# Patient Record
Sex: Female | Born: 1960 | Race: White | Hispanic: No | Marital: Married
Health system: Southern US, Community
[De-identification: ages and names within clinical notes are randomized; demographics above are authoritative.]

---

## 2013-11-27 ENCOUNTER — Other Ambulatory Visit: Payer: Self-pay | Admitting: Otolaryngology

## 2013-11-27 DIAGNOSIS — H7010 Chronic mastoiditis, unspecified ear: Secondary | ICD-10-CM

## 2013-12-04 ENCOUNTER — Ambulatory Visit
Admission: RE | Admit: 2013-12-04 | Discharge: 2013-12-04 | Disposition: A | Discharge status: Home / Self Care | Payer: BC Managed Care – PPO | Source: Ambulatory Visit | Attending: Otolaryngology | Admitting: Otolaryngology

## 2013-12-04 DIAGNOSIS — H7010 Chronic mastoiditis, unspecified ear: Secondary | ICD-10-CM

## 2013-12-04 MED ORDER — IOHEXOL 300 MG/ML  SOLN
75.0000 mL | Freq: Once | INTRAMUSCULAR | Status: AC | PRN
  Administered 2013-12-04: 75 mL via INTRAVENOUS

## 2013-12-31 ENCOUNTER — Other Ambulatory Visit: Payer: Self-pay | Admitting: Otolaryngology

## 2015-09-03 IMAGING — CT CT TEMPORAL BONES W/ CM
4 of 6 series · 17 of 30 positions shown, 19 images · IV contrast (75CC OMNI 300)
Comparison: None.

CLINICAL DATA: Chronic mastoiditis.

EXAM:
CT TEMPORAL BONES WITH CONTRAST
TECHNIQUE: Axial and coronal plane CT imaging of the petrous temporal bones was
performed with thin-collimation image reconstruction after
intravenous contrast administration. Multiplanar CT image
reconstructions were also generated.
CONTRAST:  75mL OMNIPAQUE IOHEXOL 300 MG/ML  SOLN

[Series 3: ax mag right · axial · 0.20mm/px · z∈[-45,+3]mm · 5 of 233 slices shown, 7 images]
[im 39/233  brain]
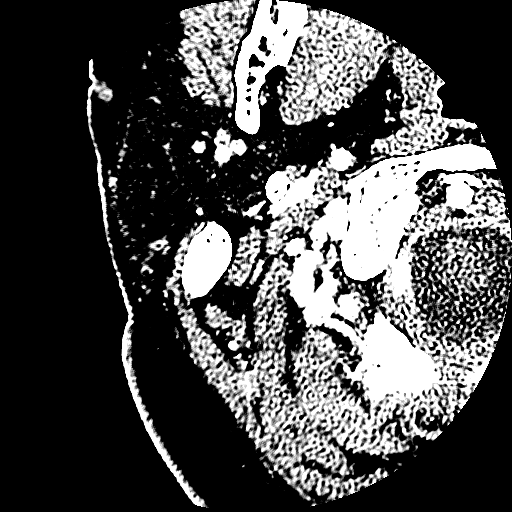
[im 39/233  bone]
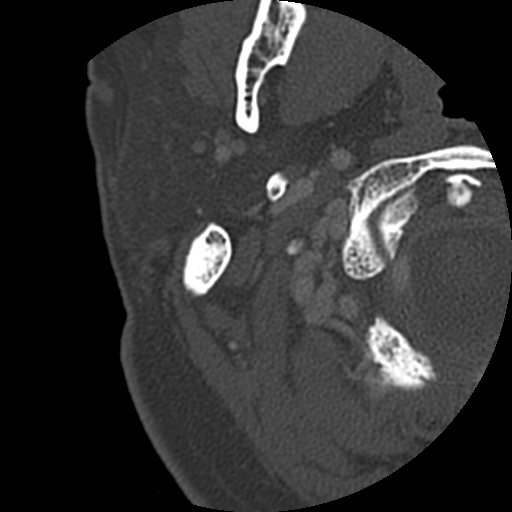
[im 78/233  bone]
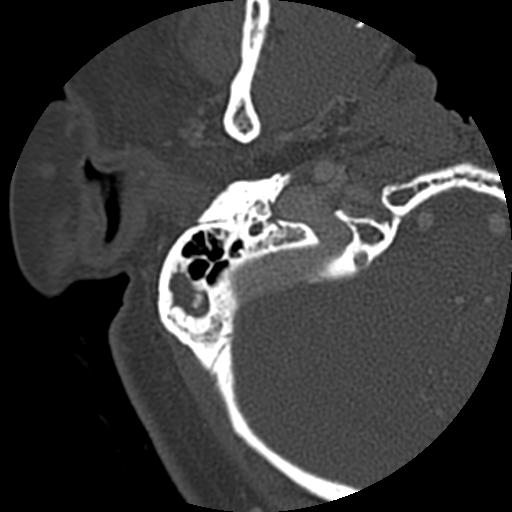
[im 117/233  bone]
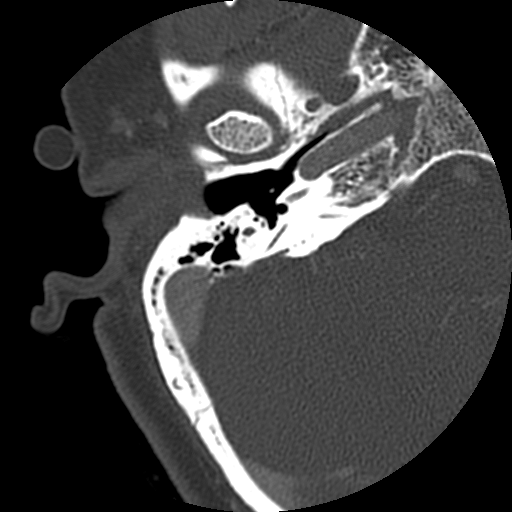
[im 155/233  bone]
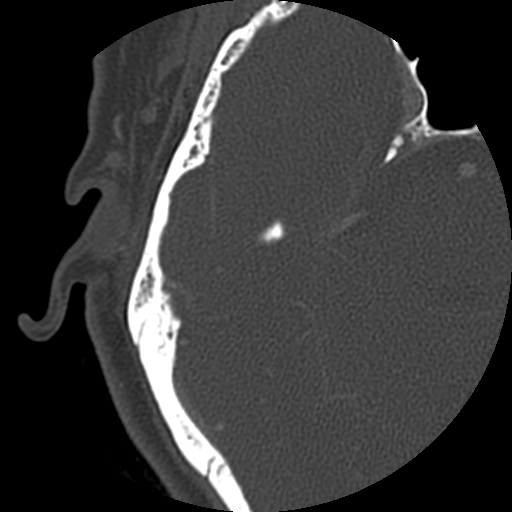
[im 194/233  brain]
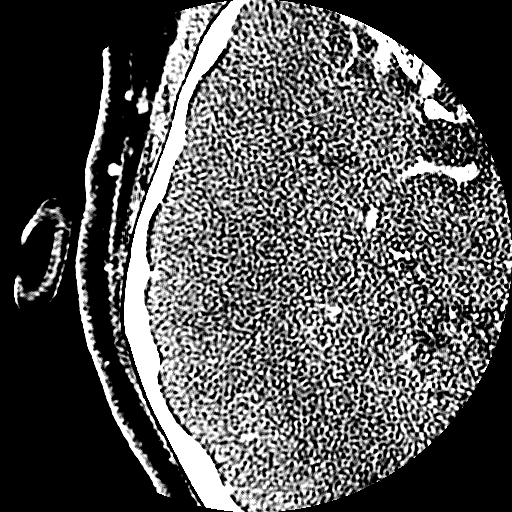
[im 194/233  bone]
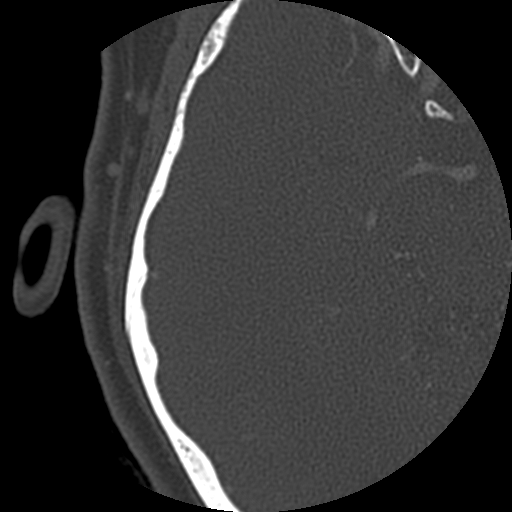

[Series 4: ax mag left · axial · 0.20mm/px · z∈[-42,+1]mm · 4 of 233 slices shown]
[im 47/233  bone]
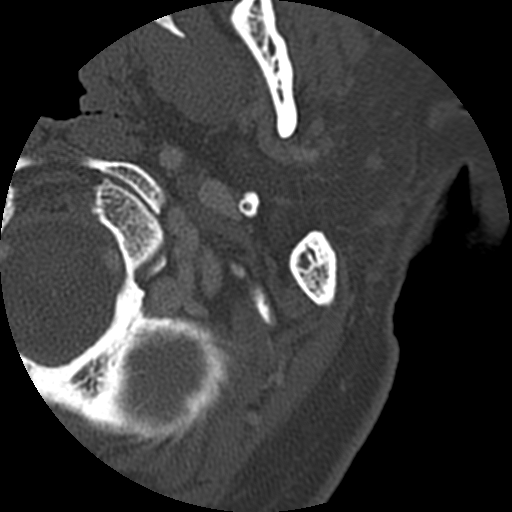
[im 93/233  bone]
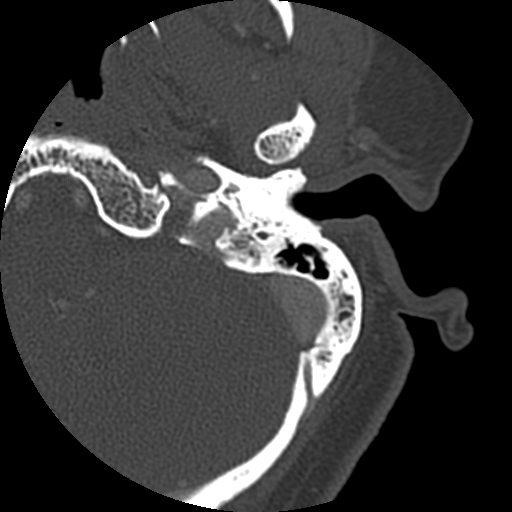
[im 140/233  bone]
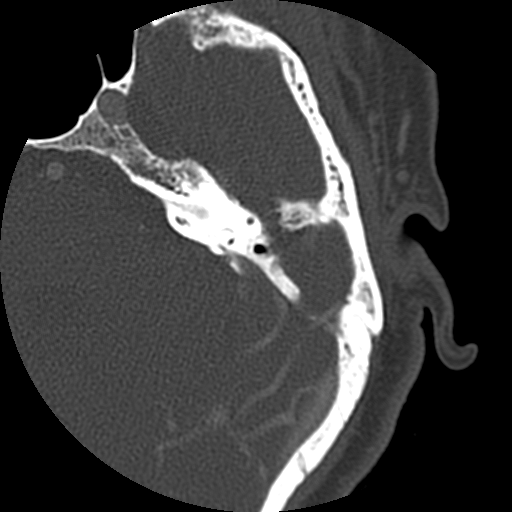
[im 186/233  bone]
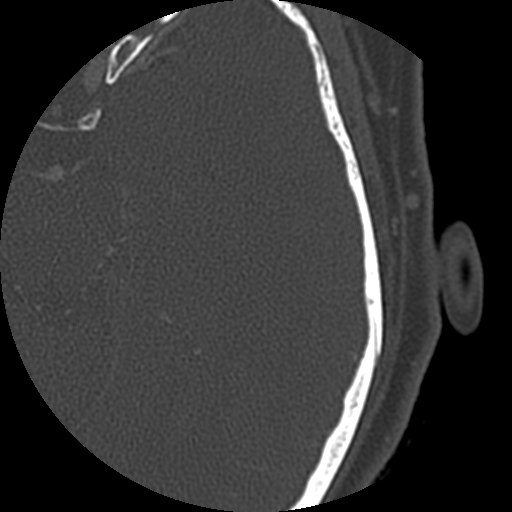

[Series 300: cor right mag · coronal · 0.20mm/px · 3 of 230 slices shown]
[im 46/230  bone]
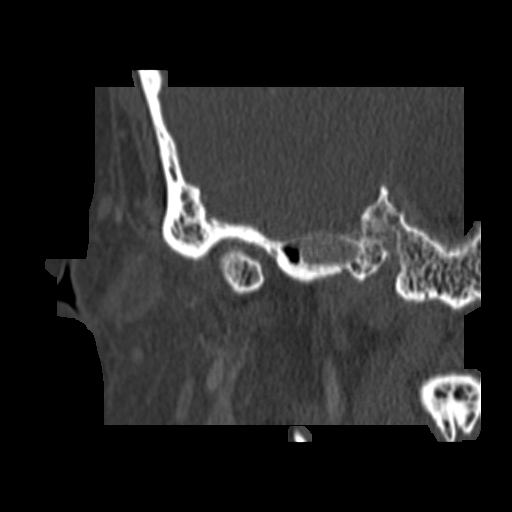
[im 92/230  bone]
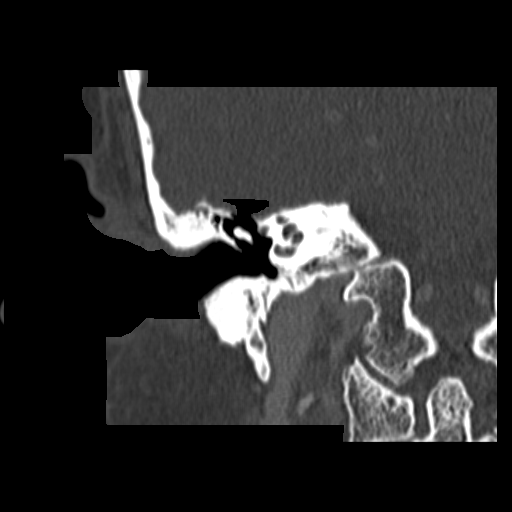
[im 138/230  bone]
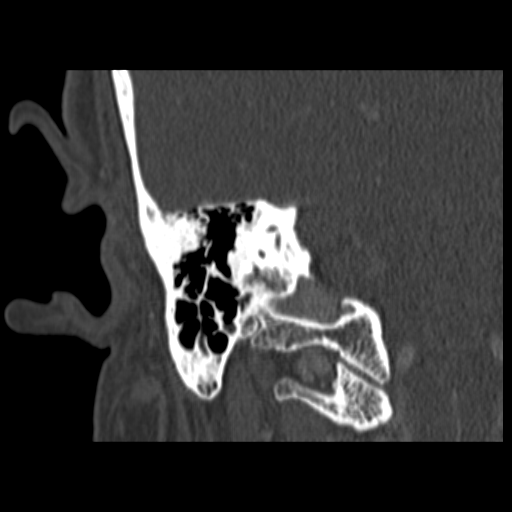

[Series 400: cor left mag · coronal · 0.20mm/px · 5 of 240 slices shown]
[im 40/240  bone]
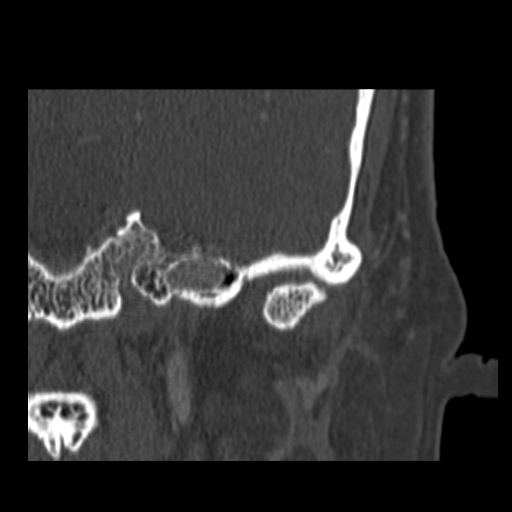
[im 80/240  bone]
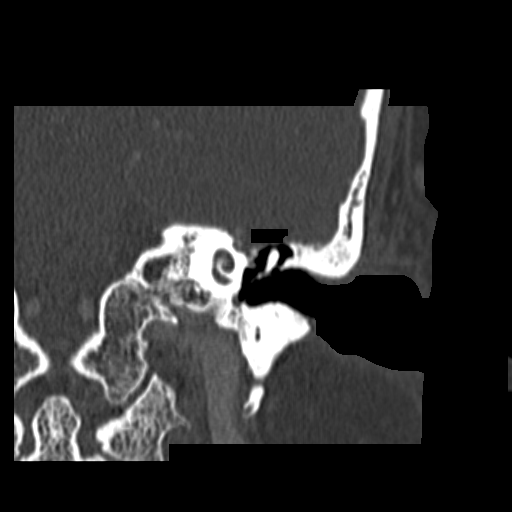
[im 120/240  bone]
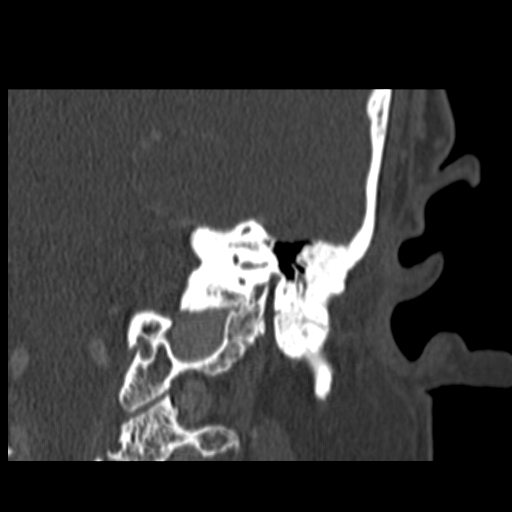
[im 160/240  bone]
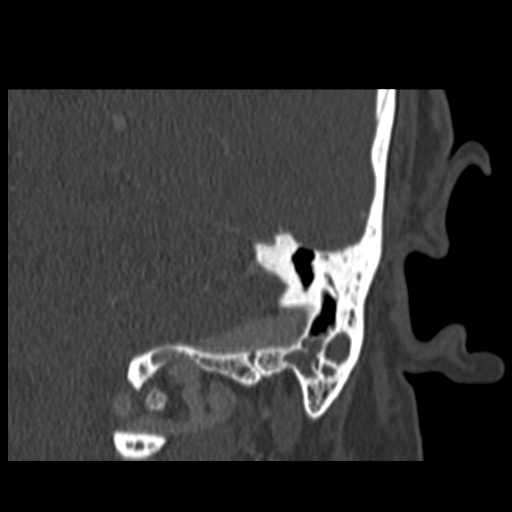
[im 200/240  bone]
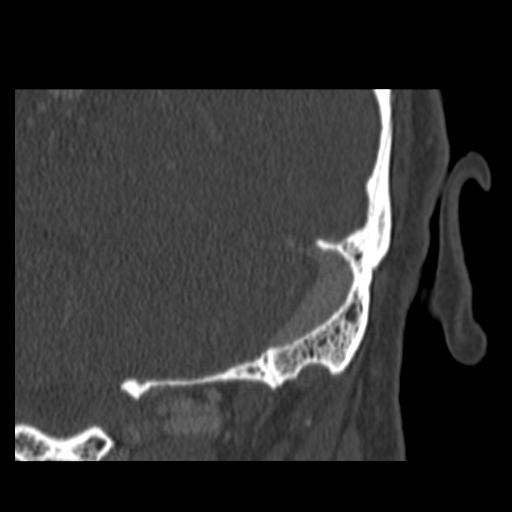

[17 of 30 positions shown; findings below may reference images not displayed]

FINDINGS: Visualized intracranial contents are negative. Visualized paranasal
sinuses are clear. Central skull base is normal. No enhancing soft
tissue mass is seen.

Right temporal bone: Mucosal edema in the mastoid tip on the right.
Mastoid sinus is underdeveloped due to chronic mastoiditis. Mastoid
antrum is clear. Ossicles and middle ear are normal. Cochlea and
vestibular apparatus are normal. Internal auditory canal is normal.
No bony destruction. External canal and tympanic membrane are normal

Left temporal bone: Mucosal thickening in the mastoid tip. Chronic
mastoiditis with underdeveloped mastoid sinus and sclerotic bony
changes as seen on the right. Middle ear and ossicles are normal. No
evidence of cholesteatoma. Inner ear structures are normal. External
canal is normal.
IMPRESSION: Chronic mastoiditis bilaterally. Mucosal thickening in the mastoid
tip bilaterally. Negative for bony destruction. Negative for
cholesteatoma.
